# Patient Record
Sex: Female | Born: 1995 | Race: White | Hispanic: No | Marital: Married | Smoking: Current every day smoker
Health system: Southern US, Community
[De-identification: ages and names within clinical notes are randomized; demographics above are authoritative.]

## PROBLEM LIST (undated history)

## (undated) DIAGNOSIS — K219 Gastro-esophageal reflux disease without esophagitis: Secondary | ICD-10-CM

## (undated) DIAGNOSIS — N200 Calculus of kidney: Secondary | ICD-10-CM

## (undated) DIAGNOSIS — K5792 Diverticulitis of intestine, part unspecified, without perforation or abscess without bleeding: Secondary | ICD-10-CM

## (undated) DIAGNOSIS — R519 Headache, unspecified: Secondary | ICD-10-CM

## (undated) DIAGNOSIS — G8929 Other chronic pain: Secondary | ICD-10-CM

## (undated) HISTORY — DX: Gastro-esophageal reflux disease without esophagitis: K21.9

## (undated) HISTORY — DX: Calculus of kidney: N20.0

## (undated) HISTORY — DX: Other chronic pain: G89.29

## (undated) HISTORY — DX: Headache, unspecified: R51.9

---

## 2019-03-11 ENCOUNTER — Other Ambulatory Visit: Payer: Self-pay | Admitting: Family Medicine

## 2019-03-11 ENCOUNTER — Ambulatory Visit
Admission: RE | Admit: 2019-03-11 | Discharge: 2019-03-11 | Disposition: A | Discharge status: Home / Self Care | Payer: Self-pay | Source: Ambulatory Visit | Attending: Family Medicine | Admitting: Family Medicine

## 2019-03-11 ENCOUNTER — Other Ambulatory Visit: Payer: Self-pay

## 2019-03-11 DIAGNOSIS — R52 Pain, unspecified: Secondary | ICD-10-CM

## 2019-03-11 MED ORDER — IOPAMIDOL (ISOVUE-300) INJECTION 61%
100.0000 mL | Freq: Once | INTRAVENOUS | Status: AC | PRN
  Administered 2019-03-11: 100 mL via INTRAVENOUS

## 2019-07-15 ENCOUNTER — Emergency Department (HOSPITAL_COMMUNITY): Payer: 59

## 2019-07-15 ENCOUNTER — Emergency Department (HOSPITAL_COMMUNITY)
Admission: EM | Admit: 2019-07-15 | Discharge: 2019-07-16 | Disposition: A | Discharge status: Home / Self Care | Payer: 59 | Attending: Emergency Medicine | Admitting: Emergency Medicine

## 2019-07-15 ENCOUNTER — Encounter (HOSPITAL_COMMUNITY): Payer: Self-pay

## 2019-07-15 ENCOUNTER — Other Ambulatory Visit: Payer: Self-pay

## 2019-07-15 DIAGNOSIS — N200 Calculus of kidney: Secondary | ICD-10-CM | POA: Insufficient documentation

## 2019-07-15 DIAGNOSIS — M545 Low back pain, unspecified: Secondary | ICD-10-CM

## 2019-07-15 DIAGNOSIS — N83202 Unspecified ovarian cyst, left side: Secondary | ICD-10-CM | POA: Insufficient documentation

## 2019-07-15 HISTORY — DX: Diverticulitis of intestine, part unspecified, without perforation or abscess without bleeding: K57.92

## 2019-07-15 LAB — COMPREHENSIVE METABOLIC PANEL
ALT: 16 U/L (ref 0–44)
AST: 21 U/L (ref 15–41)
Albumin: 5.1 g/dL — ABNORMAL HIGH (ref 3.5–5.0)
Alkaline Phosphatase: 60 U/L (ref 38–126)
Anion gap: 13 (ref 5–15)
BUN: 11 mg/dL (ref 6–20)
CO2: 23 mmol/L (ref 22–32)
Calcium: 10 mg/dL (ref 8.9–10.3)
Chloride: 103 mmol/L (ref 98–111)
Creatinine, Ser: 0.82 mg/dL (ref 0.44–1.00)
GFR calc Af Amer: >60 mL/min (ref >60)
GFR calc non Af Amer: >60 mL/min (ref >60)
Glucose, Bld: 93 mg/dL (ref 70–99)
Potassium: 3.5 mmol/L (ref 3.5–5.1)
Sodium: 139 mmol/L (ref 135–145)
Total Bilirubin: 0.7 mg/dL (ref 0.3–1.2)
Total Protein: 7.8 g/dL (ref 6.5–8.1)

## 2019-07-15 LAB — CBC
HCT: 42.4 % (ref 36.0–46.0)
Hemoglobin: 13.5 g/dL (ref 12.0–15.0)
MCH: 26.6 pg (ref 26.0–34.0)
MCHC: 31.8 g/dL (ref 30.0–36.0)
MCV: 83.5 fL (ref 80.0–100.0)
Platelets: 265 10*3/uL (ref 150–400)
RBC: 5.08 MIL/uL (ref 3.87–5.11)
RDW: 13.1 % (ref 11.5–15.5)
WBC: 8.2 10*3/uL (ref 4.0–10.5)
nRBC: 0 % (ref 0.0–0.2)

## 2019-07-15 LAB — I-STAT BETA HCG BLOOD, ED (MC, WL, AP ONLY): I-stat hCG, quantitative: <5 m[IU]/mL (ref <5)

## 2019-07-15 LAB — URINALYSIS, ROUTINE W REFLEX MICROSCOPIC
Bilirubin Urine: NEGATIVE
Glucose, UA: NEGATIVE mg/dL
Hgb urine dipstick: NEGATIVE
Ketones, ur: NEGATIVE mg/dL
Leukocytes,Ua: NEGATIVE
Nitrite: NEGATIVE
Protein, ur: NEGATIVE mg/dL
Specific Gravity, Urine: 1.005 (ref 1.005–1.030)
pH: 6 (ref 5.0–8.0)

## 2019-07-15 LAB — LIPASE, BLOOD: Lipase: 28 U/L (ref 11–51)

## 2019-07-15 MED ORDER — SODIUM CHLORIDE 0.9% FLUSH: 3.0000 mL | Freq: Once | INTRAVENOUS | Status: DC

## 2019-07-15 NOTE — ED Triage Notes (Signed)
Pt reports intermittent left lower back pain today with nausea. Pt sent here from UC to rule out kidney stone.

## 2019-07-16 MED ORDER — IBUPROFEN 800 MG PO TABS: 800.0000 mg | ORAL_TABLET | Freq: Three times a day (TID) | ORAL | 0 refills | Status: AC

## 2019-07-16 NOTE — ED Provider Notes (Signed)
[facility] EMERGENCY DEPARTMENT Provider Note   CSN: [id number] Arrival date & time: 07/15/19  2038     History   Chief Complaint Chief Complaint  Patient presents with  . Back Pain    HPI Natalie Velasquez is a 23 y.o. female.     Patient presents to the emergency department with chief complaint of left-sided flank pain.  She states she had 2 episodes of sharp stabbing pain earlier today.  She is not having pain now.  She was referred to the emergency department by urgent care, after having reported blood in her urine.  She is not on her menstrual cycle.  She denies any fevers or chills.  Denies any other associated symptoms.  The history is provided by the patient. No language interpreter was used.    Past Medical History:  Diagnosis Date  . Diverticulitis     There are no active problems to display for this patient.   History reviewed. No pertinent surgical history.   OB History   No obstetric history on file.      Home Medications    Prior to Admission medications   Medication Sig Start Date End Date Taking? Authorizing Provider  ibuprofen (ADVIL) 800 MG tablet Take 1 tablet (800 mg total) by mouth 3 (three) times daily. 07/16/19   Roxy Horseman, PA-C    Family History No family history on file.  Social History Social History   Tobacco Use  . Smoking status: Not on file  Substance Use Topics  . Alcohol use: Not on file  . Drug use: Not on file     Allergies   Patient has no allergy information on record.   Review of Systems Review of Systems  All other systems reviewed and are negative.    Physical Exam Updated Vital Signs BP 136/78   Pulse 68   Temp 98.4 F (36.9 C)   Resp 16   SpO2 100%   Physical Exam Vitals signs and nursing note reviewed.  Constitutional:      General: She is not in acute distress.    Appearance: She is well-developed.  HENT:     Head: Normocephalic and atraumatic.  Eyes:   Conjunctiva/sclera: Conjunctivae normal.  Neck:     Musculoskeletal: Neck supple.  Cardiovascular:     Rate and Rhythm: Normal rate.     Heart sounds: No murmur.  Pulmonary:     Effort: Pulmonary effort is normal. No respiratory distress.  Abdominal:     General: There is no distension.  Musculoskeletal:     Comments: Moves all extremities  Skin:    General: Skin is warm and dry.  Neurological:     Mental Status: She is alert and oriented to person, place, and time.  Psychiatric:        Mood and Affect: Mood normal.        Behavior: Behavior normal.      ED Treatments / Results  Labs (all labs ordered are listed, but only abnormal results are displayed) Labs Reviewed  COMPREHENSIVE METABOLIC PANEL - Abnormal; Notable for the following components:      Result Value   Albumin 5.1 (*)    All other components within normal limits  URINALYSIS, ROUTINE W REFLEX MICROSCOPIC - Abnormal; Notable for the following components:   Color, Urine STRAW (*)    All other components within normal limits  LIPASE, BLOOD  CBC  I-STAT BETA HCG BLOOD, ED ([facility], [facility], [facility] ONLY)  EKG None  Radiology Ct Renal Stone Study  Result Date: 07/15/2019 CLINICAL DATA:  Initial evaluation for acute sharp left-sided flank pain, nausea. EXAM: CT ABDOMEN AND PELVIS WITHOUT CONTRAST TECHNIQUE: Multidetector CT imaging of the abdomen and pelvis was performed following the standard protocol without IV contrast. COMPARISON:  Prior CT from 03/11/2019. FINDINGS: Lower chest: Visualized lung bases are clear. Hepatobiliary: Limited noncontrast evaluation liver is unremarkable. Gallbladder within normal limits. No biliary dilatation. Pancreas: Pancreas within normal limits. Spleen: Punctate granuloma noted within the spleen. Spleen otherwise unremarkable. Adrenals/Urinary Tract: Adrenal glands within normal limits. Punctate calcification within the mid-lower right kidney, nonspecific, but likely a small nonobstructive  stone. No radiopaque calculi seen along the course of the right renal collecting system. No right-sided hydronephrosis or hydroureter. On the left, no radiopaque calculi seen within the left kidney or along the course of the left renal collecting system. No left-sided hydronephrosis or hydroureter. Partially distended bladder within normal limits. No layering stones within the bladder lumen. Stomach/Bowel: Stomach within normal limits. No evidence for bowel obstruction. Appendix within normal limits. No acute inflammatory changes seen about the bowels. Vascular/Lymphatic: Intra-abdominal aorta of normal caliber. No visible adenopathy. Reproductive: Uterus and right ovary within normal limits. Incidental note made of a 17 mm simple cyst within the left ovary, likely a normal physiologic follicular cyst. Other: Trace free fluid noted within the pelvic cul-de-sac. No free intraperitoneal air. Musculoskeletal: No acute osseous abnormality. No discrete lytic or blastic osseous lesions. Dystrophic calcification seen adjacent to the right pubis symphysis, likely degenerative. IMPRESSION: 1. Punctate nonobstructive right renal nephrolithiasis. No CT evidence for obstructive uropathy. 2. 1.7 cm simple left ovarian cyst with associated trace free physiologic fluid within the pelvis, likely a normal physiologic follicular cyst. 3. No other acute intra-abdominal or pelvic process identified. Electronically Signed   By: Jeannine Boga M.D.   On: 07/15/2019 22:29    Procedures Procedures (including critical care time)  Medications Ordered in ED Medications  sodium chloride flush (NS) 0.9 % injection 3 mL (has no administration in time range)     Initial Impression / Assessment and Plan / ED Course  I have reviewed the triage vital signs and the nursing notes.  Pertinent labs & imaging results that were available during my care of the patient were reviewed by me and considered in my medical decision making  (see chart for details).        Patient with left flank pain which has resolved.  Pregnancy test negative.  CT shows left 1.7 cm ovarian cyst.  I discussed these findings with the patient.  Advised outpatient follow-up.  Also consider recently passed kidney stone, given that she does have right nephrolithiasis and did have blood in her urine earlier as reported by patient from urgent care.  Otherwise, laboratory work-up is reassuring.  Final Clinical Impressions(s) / ED Diagnoses   Final diagnoses:  Acute left-sided low back pain without sciatica  Cyst of left ovary  Right nephrolithiasis    ED Discharge Orders         Ordered    ibuprofen (ADVIL) 800 MG tablet  3 times daily     07/16/19 0219           Montine Circle, PA-C 07/16/19 9390    Veryl Speak, MD 07/16/19 [phone]

## 2019-07-16 NOTE — Discharge Instructions (Signed)
Your CT scan showed a small left-sided ovarian cyst, this should resolve in about 6 weeks.  You can take ibuprofen for pain.  If your pain or symptoms worsen, please follow-up with your doctor, OB/GYN, or return to the emergency department.  There was evidence of a very small kidney stone in your right kidney.  It is possible that you had one on the left side as well and have recently passed it we did not see any blood in your urine in the emergency department.  Otherwise, your work-up is reassuring, laboratory tests are all normal.

## 2019-07-16 NOTE — ED Notes (Signed)
Patient verbalizes understanding of discharge instructions. Opportunity for questioning and answers were provided. Armband removed by staff, pt discharged from ED ambulatory.   

## 2019-08-05 ENCOUNTER — Encounter: Payer: Self-pay | Admitting: Gastroenterology

## 2019-08-13 ENCOUNTER — Other Ambulatory Visit: Payer: Self-pay

## 2019-08-13 ENCOUNTER — Encounter: Payer: Self-pay | Admitting: Internal Medicine

## 2019-08-13 ENCOUNTER — Encounter: Payer: Self-pay | Admitting: Gastroenterology

## 2019-08-13 ENCOUNTER — Ambulatory Visit (INDEPENDENT_AMBULATORY_CARE_PROVIDER_SITE_OTHER): Payer: 59 | Admitting: Gastroenterology

## 2019-08-13 VITALS — BP 128/78 | HR 109 | Temp 98.1°F | Ht 61.0 in | Wt 98.6 lb

## 2019-08-13 DIAGNOSIS — Z1159 Encounter for screening for other viral diseases: Secondary | ICD-10-CM | POA: Diagnosis not present

## 2019-08-13 DIAGNOSIS — R1013 Epigastric pain: Secondary | ICD-10-CM | POA: Diagnosis not present

## 2019-08-13 DIAGNOSIS — R110 Nausea: Secondary | ICD-10-CM | POA: Diagnosis not present

## 2019-08-13 MED ORDER — ONDANSETRON HCL 4 MG PO TABS: 4.0000 mg | ORAL_TABLET | Freq: Four times a day (QID) | ORAL | 1 refills | Status: AC | PRN

## 2019-08-13 NOTE — Patient Instructions (Signed)
If you are age 24 or older, your body mass index should be between 23-30. Your Body mass index is 18.63 kg/m. If this is out of the aforementioned range listed, please consider follow up with your Primary Care Provider.  If you are age 59 or younger, your body mass index should be between 19-25. Your Body mass index is 18.63 kg/m. If this is out of the aformentioned range listed, please consider follow up with your Primary Care Provider.   We have sent the following medications to your pharmacy for you to pick up at your convenience:  zofran 4 mg  You have been scheduled for an endoscopy. Please follow written instructions given to you at your visit today. If you use inhalers (even only as needed), please bring them with you on the day of your procedure.

## 2019-08-13 NOTE — Progress Notes (Signed)
Assessment and plan reviewed 

## 2019-08-13 NOTE — Progress Notes (Signed)
08/13/2019 Natalie Velasquez [id number] 06-06-1996   HISTORY OF PRESENT ILLNESS: This is a pleasant 23 year old female who is new to our office.  She is here today on as a self-referral with complaints of ongoing epigastric abdominal pain and nausea.  She tells me that the symptoms began over a year ago.  She says that the nausea is present all the time, on a daily basis and that the epigastric pain has been there about the same amount of time.  She started on omeprazole over a year ago and had been taking this on a daily basis.  At some point they moved here from [place], [place] and she started seeing a family doctor in this area with these complaints.  She has now undergone 2 CT scans, one in June of the abdomen and pelvis with contrast, another in October.  These were unremarkable except for nonobstructive right kidney stones, 1.7 cm simple left ovarian cyst likely physiologic.  Anyway, the symptoms have been ongoing.  She says that they checked stool for H. pylori and that was negative.  All her other labs are unremarkable.  She has been on a gluten and dairy free diet as well as a reflux type diet since April with minimal improvement.  They also changed her PPI to pantoprazole 40 mg daily in April and she has been taking that religiously.  She says that that has seemed to help somewhat, but she just still has the same symptoms.  She says that when this all started the nausea was severe to the point that she could not even stand the smell of food.  Does not use marijuana.    Past Medical History:  Diagnosis Date  . Chronic headaches   . Diverticulitis   . GERD (gastroesophageal reflux disease)   . Kidney stones    History reviewed. No pertinent surgical history.  reports that she has been smoking. She has never used smokeless tobacco. She reports previous alcohol use. She reports that she does not use drugs. family history includes Breast cancer in her mother. Not on File    Outpatient  Encounter Medications as of 08/13/2019  Medication Sig  . pantoprazole (PROTONIX) 40 MG tablet Take 40 mg by mouth daily.  Marland Kitchen ibuprofen (ADVIL) 800 MG tablet Take 1 tablet (800 mg total) by mouth 3 (three) times daily.   No facility-administered encounter medications on file as of 08/13/2019.      REVIEW OF SYSTEMS  : All other systems reviewed and negative except where noted in the History of Present Illness.   PHYSICAL EXAM: BP 128/78 (BP Location: Left Arm, Patient Position: Sitting)   Pulse (!) 109   Temp 98.1 F (36.7 C)   Ht 5\' 1"  (1.549 m)   Wt 98 lb 9.6 oz (44.7 kg)   SpO2 98%   BMI 18.63 kg/m  General: Well developed white female in no acute distress Head: Normocephalic and atraumatic Eyes:  Sclerae anicteric, conjunctiva pink. Ears: Normal auditory acuity  Lungs: Clear throughout to auscultation; no increased WOB. Heart: Regular rate and rhythm; no M/R/G. Abdomen: Soft, non-distended.  Present.  Non-tender. Musculoskeletal: Symmetrical with no gross deformities  Skin: No lesions on visible extremities Extremities: No edema  Neurological: Alert oriented x 4, grossly non-focal Psychological:  Alert and cooperative. Normal mood and affect  ASSESSMENT AND PLAN: *Chronic epigastric pain and nausea despite PPI therapy, although symptoms are mildly improved.  Symptoms present for over a year at this point.  Will continue  pantoprazole 40 mg daily for now.  Will schedule EGD with Dr. Henrene Pastor for evaluation.  Will try zofran prn as well.  Prescription sent.  **The risks, benefits, and alternatives to EGD were discussed with the patient and she consents to proceed.    CC:  Christa See, FNP

## 2019-08-15 ENCOUNTER — Ambulatory Visit (INDEPENDENT_AMBULATORY_CARE_PROVIDER_SITE_OTHER): Payer: 59

## 2019-08-15 DIAGNOSIS — Z1159 Encounter for screening for other viral diseases: Secondary | ICD-10-CM

## 2019-08-18 ENCOUNTER — Telehealth: Payer: Self-pay | Admitting: *Deleted

## 2019-08-18 LAB — SARS CORONAVIRUS 2 (TAT 6-24 HRS): SARS Coronavirus 2: NEGATIVE

## 2019-08-18 NOTE — Telephone Encounter (Signed)
Left message to offer earlier appointment time.pt. called back and appointment moved to earlier slot.

## 2019-08-19 ENCOUNTER — Ambulatory Visit (AMBULATORY_SURGERY_CENTER): Payer: 59 | Admitting: Internal Medicine

## 2019-08-19 ENCOUNTER — Encounter: Payer: Self-pay | Admitting: Internal Medicine

## 2019-08-19 ENCOUNTER — Other Ambulatory Visit: Payer: Self-pay

## 2019-08-19 VITALS — BP 124/73 | HR 70 | Temp 98.7°F | Resp 18

## 2019-08-19 DIAGNOSIS — R1013 Epigastric pain: Secondary | ICD-10-CM

## 2019-08-19 DIAGNOSIS — R110 Nausea: Secondary | ICD-10-CM | POA: Diagnosis not present

## 2019-08-19 MED ORDER — SODIUM CHLORIDE 0.9 % IV SOLN: 500.0000 mL | Freq: Once | INTRAVENOUS | Status: DC

## 2019-08-19 NOTE — Progress Notes (Signed)
A/ox3, pleased with MAC, report to RN 

## 2019-08-19 NOTE — Progress Notes (Signed)
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

## 2019-08-19 NOTE — Op Note (Signed)
[facility] Patient Name: Natalie Velasquez Procedure Date: 08/19/2019 1:22 PM MRN: [id number] Endoscopist: Wilhemina Bonito. Marina Goodell , MD Age: 23 Referring MD:  Date of Birth: 10-May-1996 Gender: Female Account #: [id number] Procedure:                Upper GI endoscopy Indications:              Epigastric abdominal pain, Nausea Medicines:                Monitored Anesthesia Care Procedure:                Pre-Anesthesia Assessment:                           - Prior to the procedure, a History and Physical                            was performed, and patient medications and                            allergies were reviewed. The patient's tolerance of                            previous anesthesia was also reviewed. The risks                            and benefits of the procedure and the sedation                            options and risks were discussed with the patient.                            All questions were answered, and informed consent                            was obtained. Prior Anticoagulants: The patient has                            taken no previous anticoagulant or antiplatelet                            agents. ASA Grade Assessment: I - A normal, healthy                            patient. After reviewing the risks and benefits,                            the patient was deemed in satisfactory condition to                            undergo the procedure.                           After obtaining informed consent, the endoscope was  passed under direct vision. Throughout the                            procedure, the patient's blood pressure, pulse, and                            oxygen saturations were monitored continuously. The                            Endoscope was introduced through the mouth, and                            advanced to the second part of duodenum. The upper                            GI endoscopy was accomplished  without difficulty.                            The patient tolerated the procedure well. Scope In: Scope Out: Findings:                 The esophagus was normal.                           The stomach was normal.                           The examined duodenum was normal.                           The cardia and gastric fundus were normal on                            retroflexion. Complications:            No immediate complications. Estimated Blood Loss:     Estimated blood loss: none. Impression:               1. Normal upper endoscopy                           2. No cause for symptoms found. Recommendation:           1. Continue current medications                           2. Schedule abdominal ultrasound "recurrent                            epigastric pain and nausea, rule out gallstones".                            We will contact you with the results                           3. Resume previous diet. Docia Chuck. Henrene Pastor, MD 08/19/2019 1:36:54 PM This report has been signed electronically.

## 2019-08-19 NOTE — Patient Instructions (Signed)
Discharge instructions: Continue current medications Resume previous diet The office will call you to schedule an abdominal ultrasound    YOU HAD AN ENDOSCOPIC PROCEDURE TODAY AT [place]:   Refer to the procedure report that was given to you for any specific questions about what was found during the examination.  If the procedure report does not answer your questions, please call your gastroenterologist to clarify.  If you requested that your care partner not be given the details of your procedure findings, then the procedure report has been included in a sealed envelope for you to review at your convenience later.  YOU SHOULD EXPECT: Some feelings of bloating in the abdomen. Passage of more gas than usual.  Walking can help get rid of the air that was put into your GI tract during the procedure and reduce the bloating. If you had a lower endoscopy (such as a colonoscopy or flexible sigmoidoscopy) you may notice spotting of blood in your stool or on the toilet paper. If you underwent a bowel prep for your procedure, you may not have a normal bowel movement for a few days.  Please Note:  You might notice some irritation and congestion in your nose or some drainage.  This is from the oxygen used during your procedure.  There is no need for concern and it should clear up in a day or so.  SYMPTOMS TO REPORT IMMEDIATELY:    Following upper endoscopy (EGD)  Vomiting of blood or coffee ground material  New chest pain or pain under the shoulder blades  Painful or persistently difficult swallowing  New shortness of breath  Fever of 100F or higher  Black, tarry-looking stools  For urgent or emergent issues, a gastroenterologist can be reached at any hour by calling [phone].   DIET:  We do recommend a small meal at first, but then you may proceed to your regular diet.  Drink plenty of fluids but you should avoid alcoholic beverages for 24 hours.  ACTIVITY:  You should  plan to take it easy for the rest of today and you should NOT DRIVE or use heavy machinery until tomorrow (because of the sedation medicines used during the test).    FOLLOW UP: Our staff will call the number listed on your records 48-72 hours following your procedure to check on you and address any questions or concerns that you may have regarding the information given to you following your procedure. If we do not reach you, we will leave a message.  We will attempt to reach you two times.  During this call, we will ask if you have developed any symptoms of COVID 19. If you develop any symptoms (ie: fever, flu-like symptoms, shortness of breath, cough etc.) before then, please call [phone].  If you test positive for Covid 19 in the 2 weeks post procedure, please call and report this information to [place].    If any biopsies were taken you will be contacted by phone or by letter within the next 1-3 weeks.  Please call us at [phone] if you have not heard about the biopsies in 3 weeks.    SIGNATURES/CONFIDENTIALITY: You and/or your care partner have signed paperwork which will be entered into your electronic medical record.  These signatures attest to the fact that that the information above on your After Visit Summary has been reviewed and is understood.  Full responsibility of the confidentiality of this discharge information lies with you and/or your care-partner.

## 2019-08-19 NOTE — Progress Notes (Signed)
Pt's states no medical or surgical changes since previsit or office visit.  Vitals- Courtney Covid- June 

## 2019-08-20 ENCOUNTER — Telehealth: Payer: Self-pay

## 2019-08-20 ENCOUNTER — Other Ambulatory Visit: Payer: Self-pay

## 2019-08-20 DIAGNOSIS — R1013 Epigastric pain: Secondary | ICD-10-CM

## 2019-08-20 NOTE — Telephone Encounter (Signed)
Pt scheduled for [place] of abd at [facility] 09/01/19@8 :30am, pt to arrive there at 8:15am. Pt to be NPO after midnight. Left message for pt to call back.

## 2019-08-25 ENCOUNTER — Telehealth: Payer: Self-pay

## 2019-08-25 NOTE — Telephone Encounter (Signed)
  Follow up Call-  Call back number 08/19/2019  Post procedure Call Back phone  # [phone]  Permission to leave phone message Yes     Patient questions:  Do you have a fever, pain , or abdominal swelling? No. Pain Score  0 *  Have you tolerated food without any problems? Yes.    Have you been able to return to your normal activities? Yes.    Do you have any questions about your discharge instructions: Diet   No. Medications  No. Follow up visit  No.  Do you have questions or concerns about your Care? No.  Actions: * If pain score is 4 or above: No action needed, pain <4.  1. Have you developed a fever since your procedure? no  2.   Have you had an respiratory symptoms (SOB or cough) since your procedure? no  3.   Have you tested positive for COVID 19 since your procedure no  4.   Have you had any family members/close contacts diagnosed with the COVID 19 since your procedure?  no   If yes to any of these questions please route to Joylene John, RN and Alphonsa Gin, Therapist, sports.

## 2019-08-27 NOTE — Telephone Encounter (Signed)
Pt has not called back. Message sent to pt via mychart.

## 2019-09-01 ENCOUNTER — Other Ambulatory Visit: Payer: Self-pay

## 2019-09-01 ENCOUNTER — Ambulatory Visit (HOSPITAL_COMMUNITY)
Admission: RE | Admit: 2019-09-01 | Discharge: 2019-09-01 | Disposition: A | Discharge status: Home / Self Care | Payer: 59 | Source: Ambulatory Visit | Attending: Internal Medicine | Admitting: Internal Medicine

## 2019-09-01 DIAGNOSIS — R1013 Epigastric pain: Secondary | ICD-10-CM

## 2019-11-11 DIAGNOSIS — R110 Nausea: Secondary | ICD-10-CM

## 2019-11-11 DIAGNOSIS — R1013 Epigastric pain: Secondary | ICD-10-CM

## 2019-11-12 NOTE — Telephone Encounter (Signed)
Patient states she is taking her PPI in the morning. Informed patient to switch it to 30 minutes before dinner since her problem seems to be mainly at night. Also informed patient that we are going to schedule a HIDA scan to check her gallbladder again. Patient voiced understanding. Informed patient I would scheduled the scan and send her a mychart message with the time and date.

## 2019-11-20 ENCOUNTER — Other Ambulatory Visit: Payer: Self-pay

## 2019-11-20 ENCOUNTER — Encounter (HOSPITAL_COMMUNITY)
Admission: RE | Admit: 2019-11-20 | Discharge: 2019-11-20 | Disposition: A | Discharge status: Home / Self Care | Payer: BC Managed Care – PPO | Source: Ambulatory Visit | Attending: Gastroenterology | Admitting: Gastroenterology

## 2019-11-20 DIAGNOSIS — R1013 Epigastric pain: Secondary | ICD-10-CM | POA: Diagnosis present

## 2019-11-20 DIAGNOSIS — R110 Nausea: Secondary | ICD-10-CM

## 2019-11-20 MED ORDER — TECHNETIUM TC 99M MEBROFENIN IV KIT
5.5000 | PACK | Freq: Once | INTRAVENOUS | Status: AC | PRN
  Administered 2019-11-20: 08:00:00 5.5 via INTRAVENOUS

## 2021-05-14 IMAGING — CT CT RENAL STONE PROTOCOL
2 of 4 series · 16 of 46 positions shown, 18 images · non-contrast
Comparison: Prior CT from 03/11/2019.

CLINICAL DATA: Initial evaluation for acute sharp left-sided flank
pain, nausea.

EXAM:
CT ABDOMEN AND PELVIS WITHOUT CONTRAST
TECHNIQUE: Multidetector CT imaging of the abdomen and pelvis was performed
following the standard protocol without IV contrast.

[Series 3: stone study 5.0 i30f 2 · axial · 0.74mm/px · z∈[-514,-159]mm · 13 of 79 slices shown, 15 images]
[im 4/79  soft-tissue]
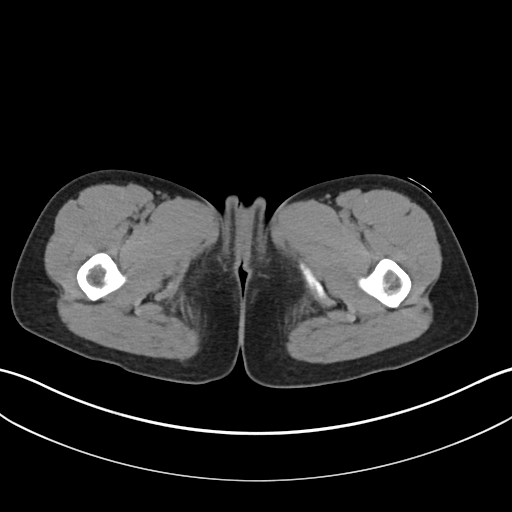
[im 4/79  bone]
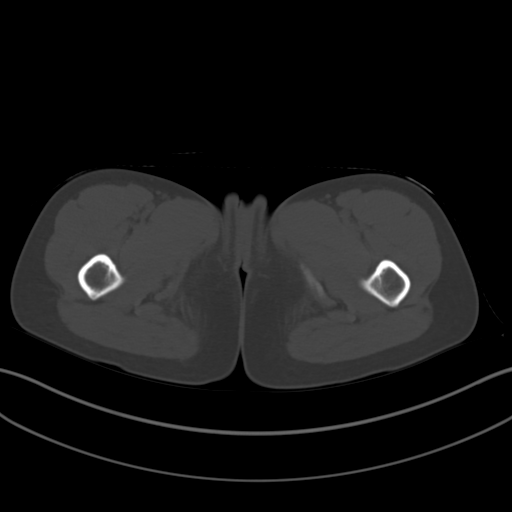
[im 10/79  soft-tissue]
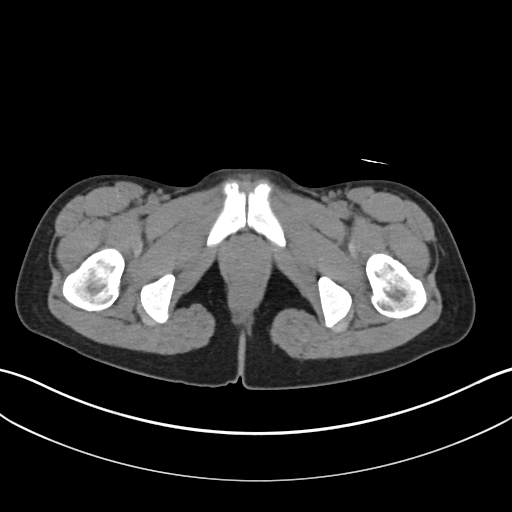
[im 16/79  soft-tissue]
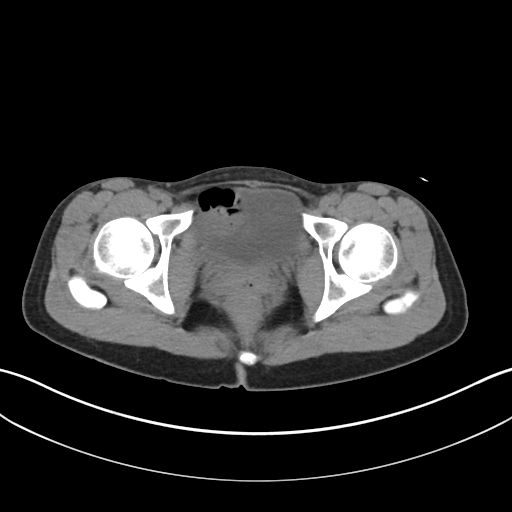
[im 22/79  soft-tissue]
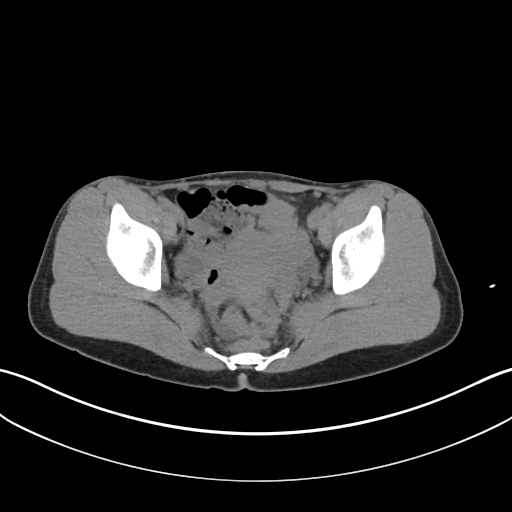
[im 29/79  soft-tissue]
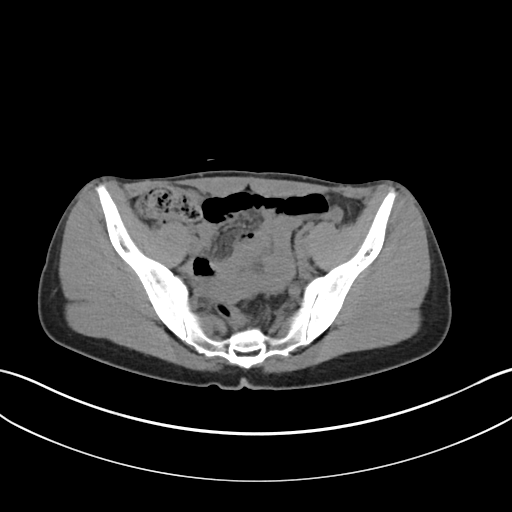
[im 35/79  soft-tissue]
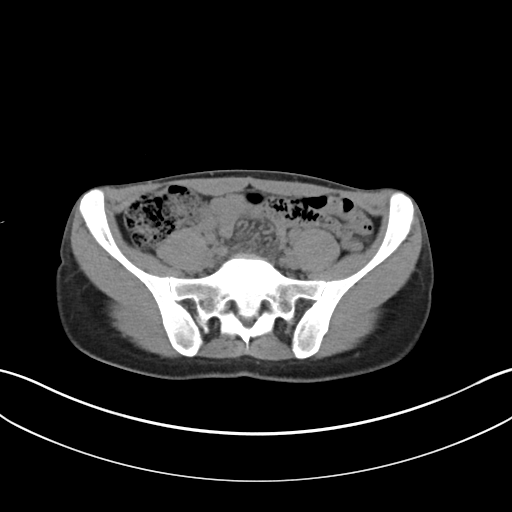
[im 41/79  soft-tissue]
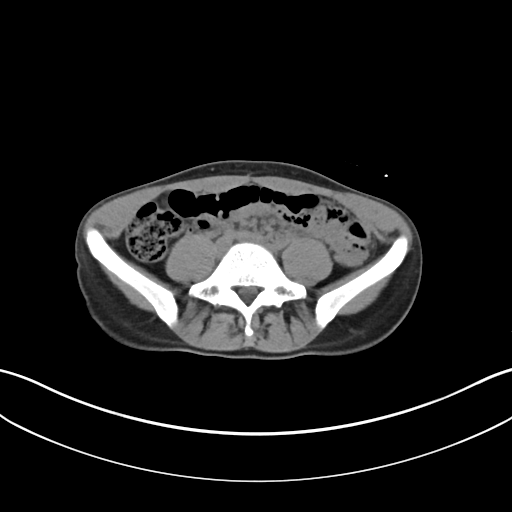
[im 44/79  soft-tissue]
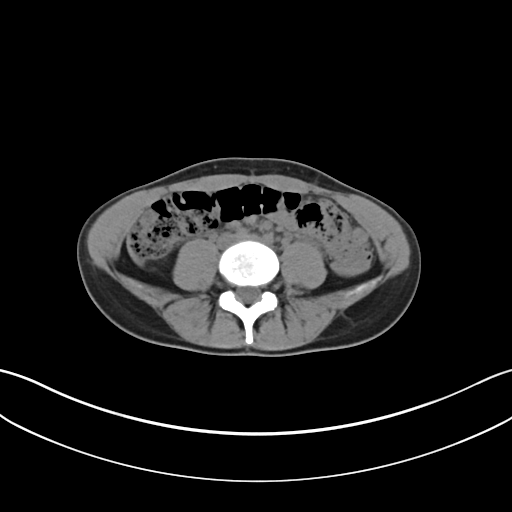
[im 50/79  soft-tissue]
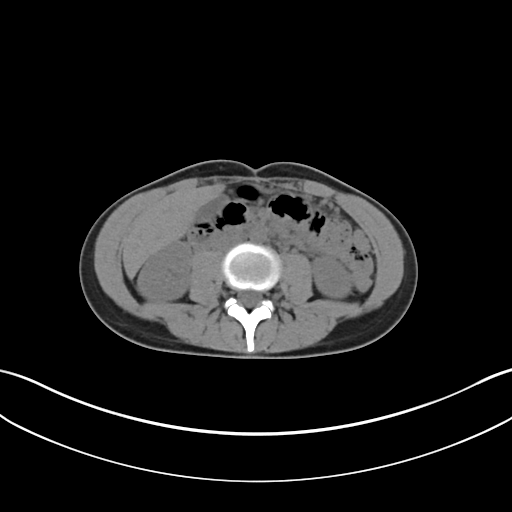
[im 50/79  bone]
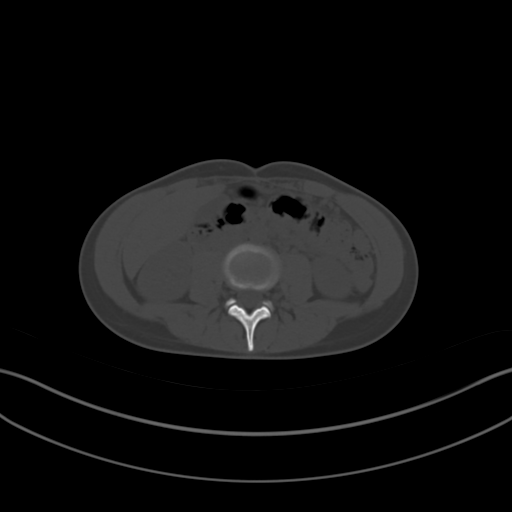
[im 57/79  soft-tissue]
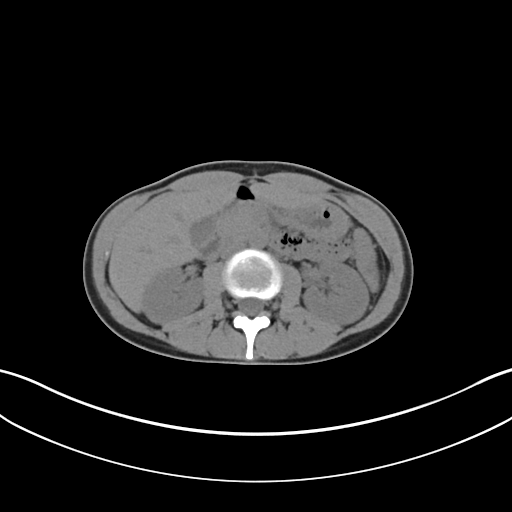
[im 63/79  soft-tissue]
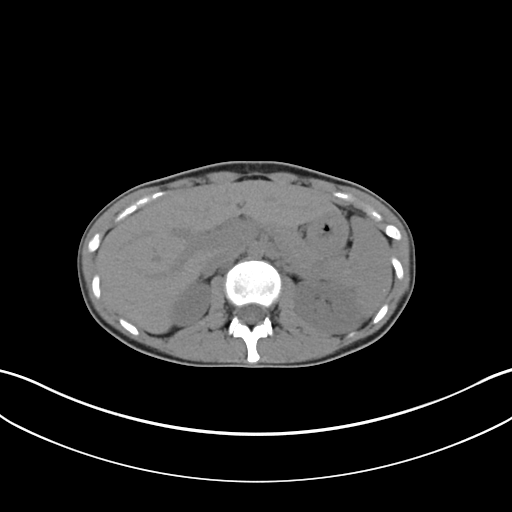
[im 69/79  soft-tissue]
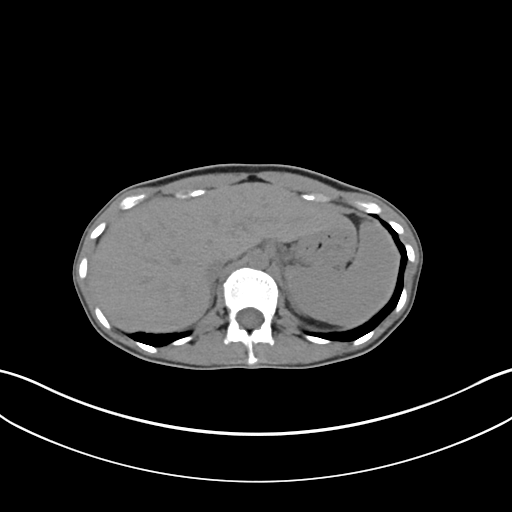
[im 75/79  soft-tissue]
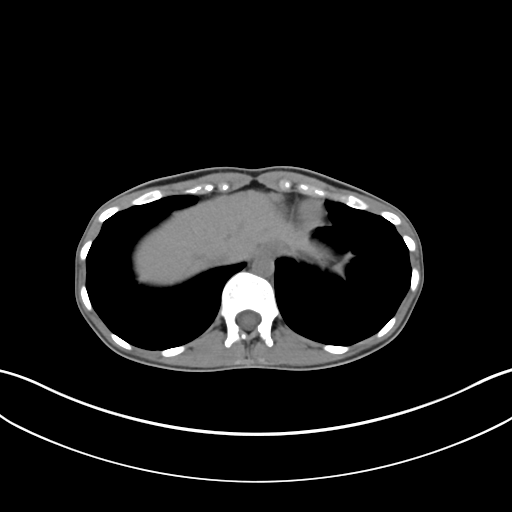

[Series 6: coronal soft tissue · coronal · 0.72mm/px · 3 of 67 slices shown]
[im 23/67  soft-tissue]
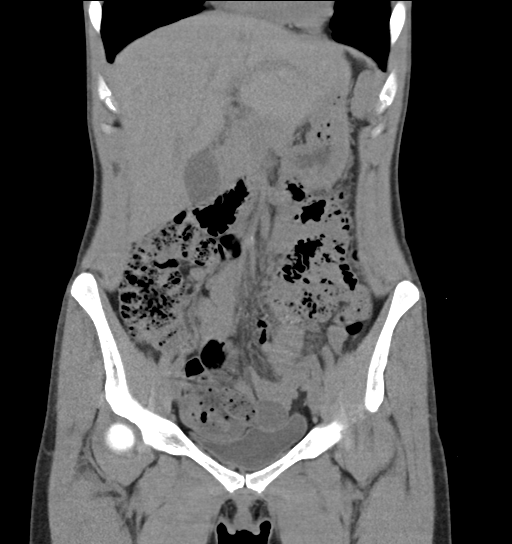
[im 30/67  soft-tissue]
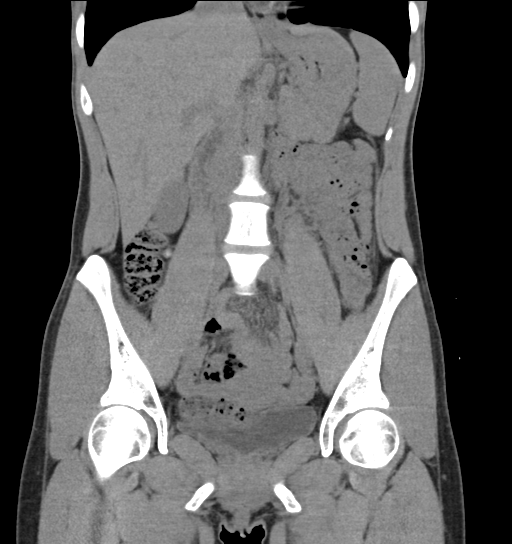
[im 37/67  soft-tissue]
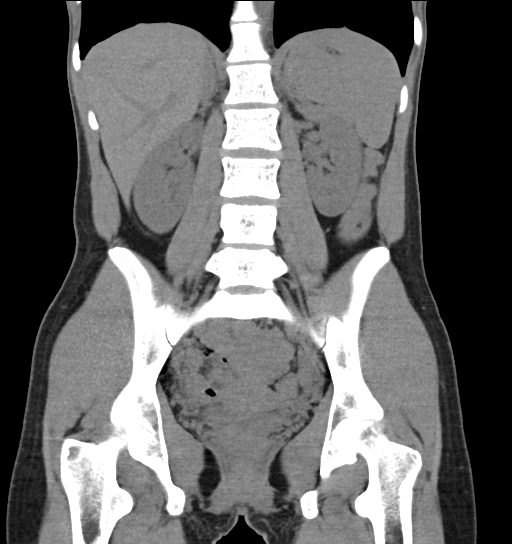

[16 of 46 positions shown; findings below may reference images not displayed]

FINDINGS: Lower chest: Visualized lung bases are clear.

Hepatobiliary: Limited noncontrast evaluation liver is unremarkable.
Gallbladder within normal limits. No biliary dilatation.

Pancreas: Pancreas within normal limits.

Spleen: Punctate granuloma noted within the spleen. Spleen otherwise
unremarkable.

Adrenals/Urinary Tract: Adrenal glands within normal limits.

Punctate calcification within the mid-lower right kidney,
nonspecific, but likely a small nonobstructive stone. No radiopaque
calculi seen along the course of the right renal collecting system.
No right-sided hydronephrosis or hydroureter.

On the left, no radiopaque calculi seen within the left kidney or
along the course of the left renal collecting system. No left-sided
hydronephrosis or hydroureter.

Partially distended bladder within normal limits. No layering stones
within the bladder lumen.

Stomach/Bowel: Stomach within normal limits. No evidence for bowel
obstruction. Appendix within normal limits. No acute inflammatory
changes seen about the bowels.

Vascular/Lymphatic: Intra-abdominal aorta of normal caliber. No
visible adenopathy.

Reproductive: Uterus and right ovary within normal limits.
Incidental note made of a 17 mm simple cyst within the left ovary,
likely a normal physiologic follicular cyst.

Other: Trace free fluid noted within the pelvic cul-de-sac. No free
intraperitoneal air.

Musculoskeletal: No acute osseous abnormality. No discrete lytic or
blastic osseous lesions. Dystrophic calcification seen adjacent to
the right pubis symphysis, likely degenerative.
IMPRESSION: 1. Punctate nonobstructive right renal nephrolithiasis. No CT
evidence for obstructive uropathy.
2. 1.7 cm simple left ovarian cyst with associated trace free
physiologic fluid within the pelvis, likely a normal physiologic
follicular cyst.
3. No other acute intra-abdominal or pelvic process identified.
